# Patient Record
Sex: Female | Born: 1985 | Race: Black or African American | Hispanic: No | Marital: Single | Smoking: Never smoker
Health system: Southern US, Community
[De-identification: ages and names within clinical notes are randomized; demographics above are authoritative.]

---
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
## 2006-01-24 ENCOUNTER — Ambulatory Visit (HOSPITAL_COMMUNITY): Admission: RE | Admit: 2006-01-24 | Discharge: 2006-01-24 | Payer: Self-pay | Admitting: Obstetrics & Gynecology

## 2006-02-05 ENCOUNTER — Ambulatory Visit (HOSPITAL_COMMUNITY): Admission: RE | Admit: 2006-02-05 | Discharge: 2006-02-05 | Payer: Self-pay | Admitting: *Deleted

## 2006-02-13 ENCOUNTER — Ambulatory Visit: Payer: Self-pay | Admitting: Gynecology

## 2006-03-06 ENCOUNTER — Ambulatory Visit: Payer: Self-pay | Admitting: Gynecology

## 2006-03-27 ENCOUNTER — Ambulatory Visit: Payer: Self-pay | Admitting: Gynecology

## 2006-03-27 ENCOUNTER — Ambulatory Visit (HOSPITAL_COMMUNITY): Admission: RE | Admit: 2006-03-27 | Discharge: 2006-03-27 | Payer: Self-pay | Admitting: Obstetrics & Gynecology

## 2006-04-10 ENCOUNTER — Ambulatory Visit: Payer: Self-pay | Admitting: Family Medicine

## 2006-04-24 ENCOUNTER — Ambulatory Visit: Payer: Self-pay | Admitting: Family Medicine

## 2006-05-08 ENCOUNTER — Ambulatory Visit: Payer: Self-pay | Admitting: Gynecology

## 2006-05-15 ENCOUNTER — Ambulatory Visit (HOSPITAL_COMMUNITY): Admission: RE | Admit: 2006-05-15 | Discharge: 2006-05-15 | Payer: Self-pay | Admitting: Obstetrics & Gynecology

## 2006-05-15 ENCOUNTER — Ambulatory Visit: Payer: Self-pay | Admitting: Family Medicine

## 2006-05-29 ENCOUNTER — Ambulatory Visit: Payer: Self-pay | Admitting: Gynecology

## 2006-06-05 ENCOUNTER — Ambulatory Visit: Payer: Self-pay | Admitting: Gynecology

## 2006-06-12 ENCOUNTER — Ambulatory Visit: Payer: Self-pay | Admitting: Gynecology

## 2006-06-19 ENCOUNTER — Inpatient Hospital Stay (HOSPITAL_COMMUNITY): Admission: AD | Admit: 2006-06-19 | Discharge: 2006-06-19 | Payer: Self-pay | Admitting: *Deleted

## 2006-06-19 ENCOUNTER — Ambulatory Visit: Payer: Self-pay | Admitting: Family Medicine

## 2006-06-19 ENCOUNTER — Ambulatory Visit: Payer: Self-pay | Admitting: Obstetrics and Gynecology

## 2006-06-26 ENCOUNTER — Ambulatory Visit: Payer: Self-pay | Admitting: Gynecology

## 2006-06-27 ENCOUNTER — Inpatient Hospital Stay (HOSPITAL_COMMUNITY): Admission: AD | Admit: 2006-06-27 | Discharge: 2006-06-28 | Payer: Self-pay | Admitting: Gynecology

## 2006-06-27 ENCOUNTER — Ambulatory Visit: Payer: Self-pay | Admitting: Gynecology

## 2006-07-03 ENCOUNTER — Ambulatory Visit: Payer: Self-pay | Admitting: Obstetrics and Gynecology

## 2006-07-03 ENCOUNTER — Inpatient Hospital Stay (HOSPITAL_COMMUNITY): Admission: RE | Admit: 2006-07-03 | Discharge: 2006-07-07 | Payer: Self-pay | Admitting: Family Medicine

## 2006-07-03 ENCOUNTER — Ambulatory Visit: Payer: Self-pay | Admitting: Family Medicine

## 2007-03-04 ENCOUNTER — Ambulatory Visit: Payer: Self-pay | Admitting: Family Medicine

## 2007-03-11 ENCOUNTER — Encounter (INDEPENDENT_AMBULATORY_CARE_PROVIDER_SITE_OTHER): Payer: Self-pay | Admitting: Family Medicine

## 2007-06-23 IMAGING — US US OB COMP +14 WK
2 series · 13 of 28 positions shown · non-contrast
Comparison: none

CLINICAL DATA: Anatomy.

[Series 1: us ob comp +14 wk · 0.35mm/px · 8 of 55 slices shown (1 of 2)]
[im 4/55]
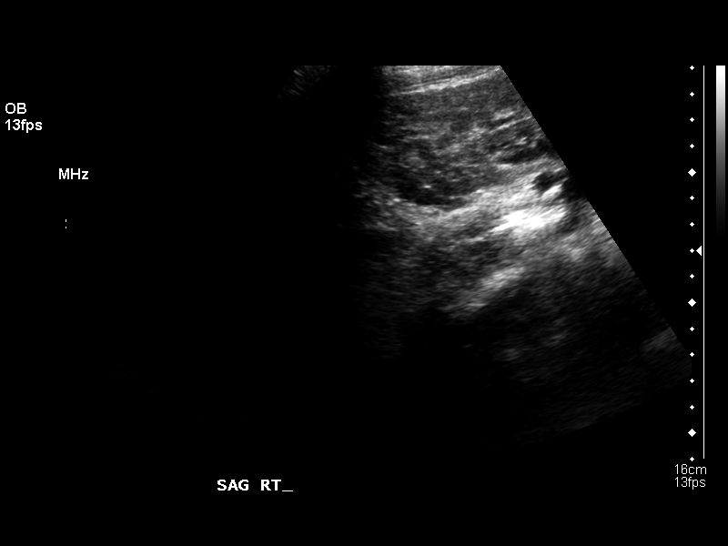
[im 11/55]
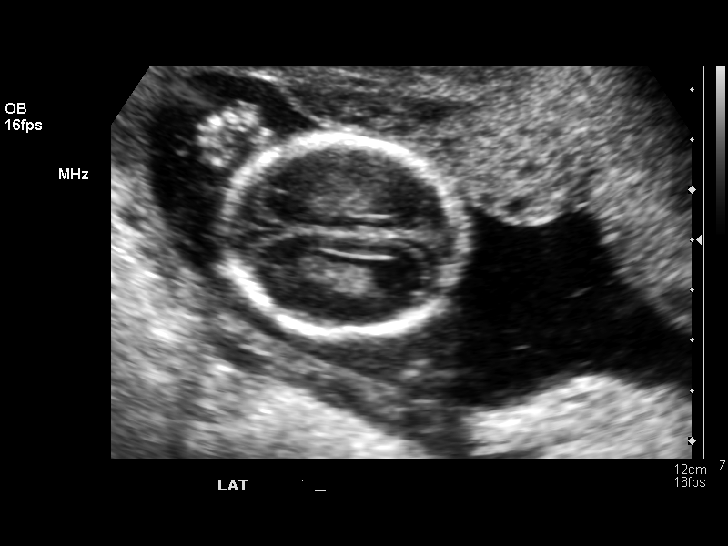
[im 17/55]
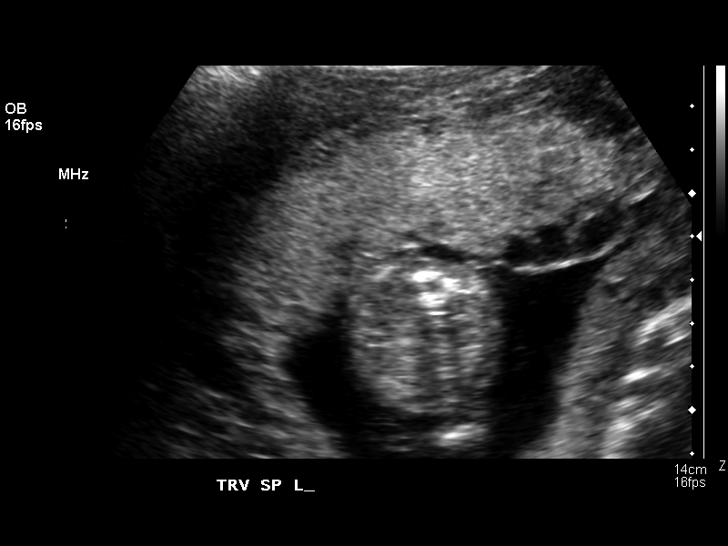
[im 24/55]
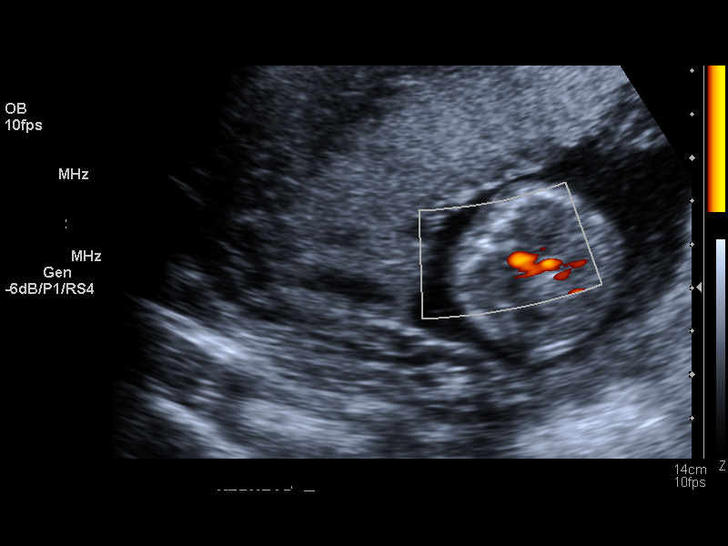
[im 31/55]
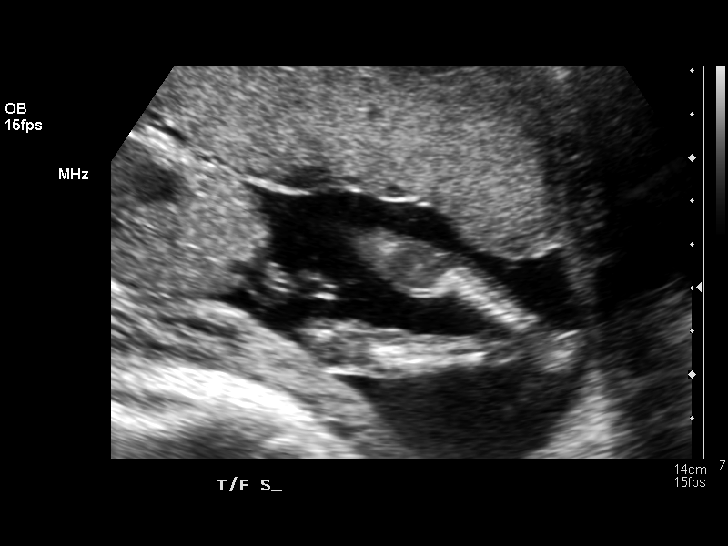
[im 38/55]
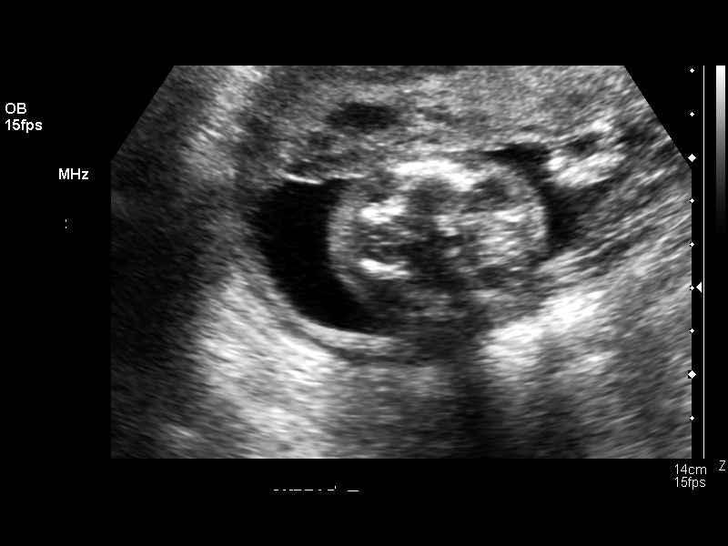
[im 48/55]
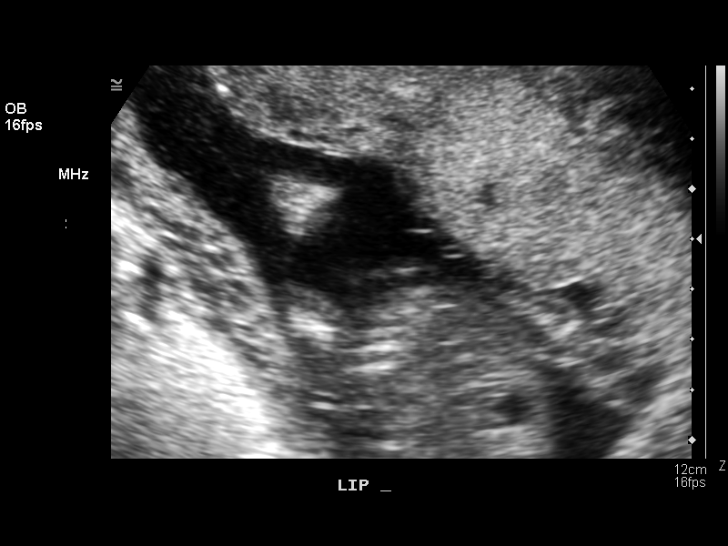
[im 55/55]
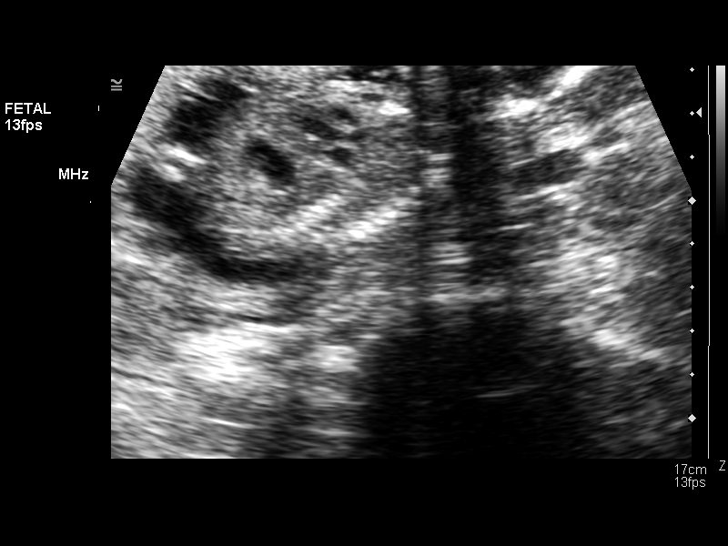

[Series 1: us ob comp +14 wk · 0.35mm/px · 5 of 36 slices shown (2 of 2)]
[im 4/36]
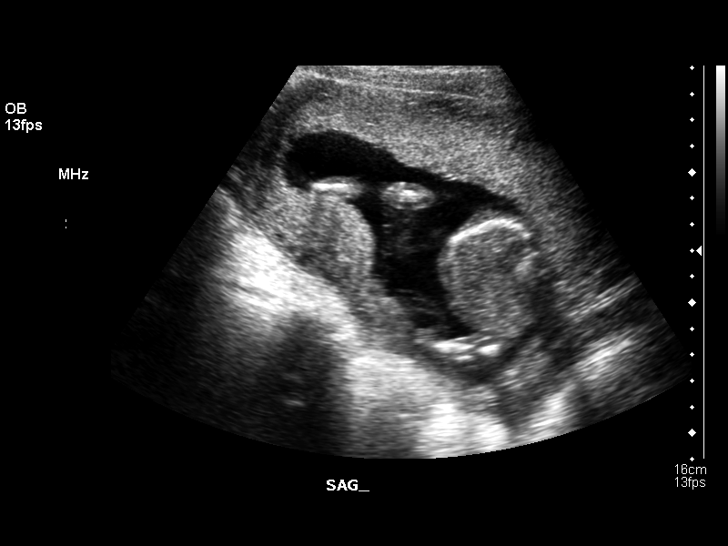
[im 11/36]
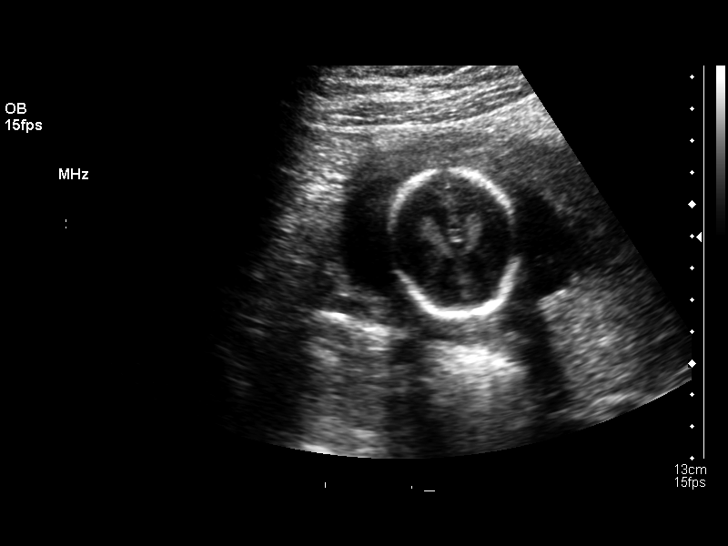
[im 18/36]
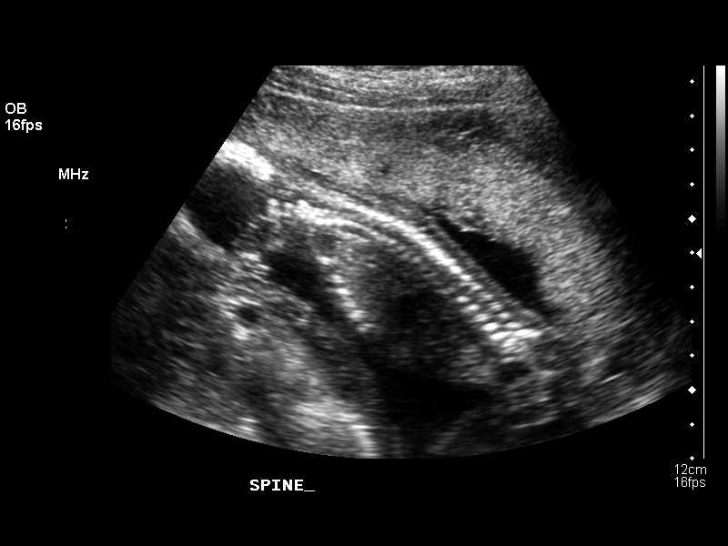
[im 25/36]
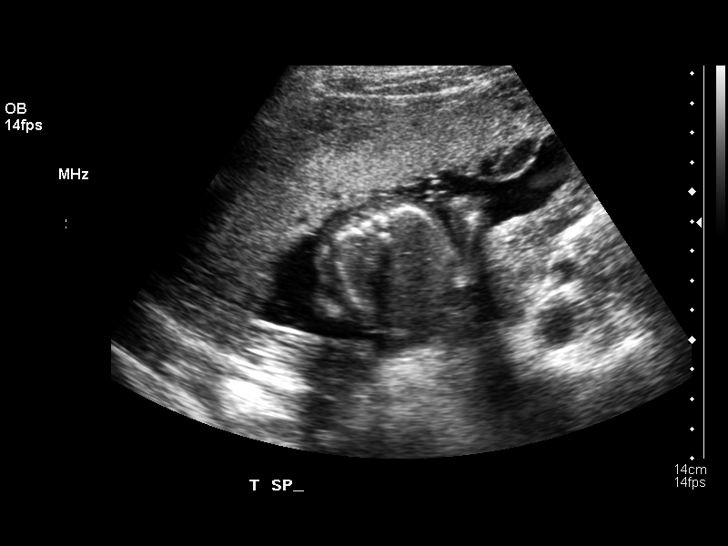
[im 32/36]
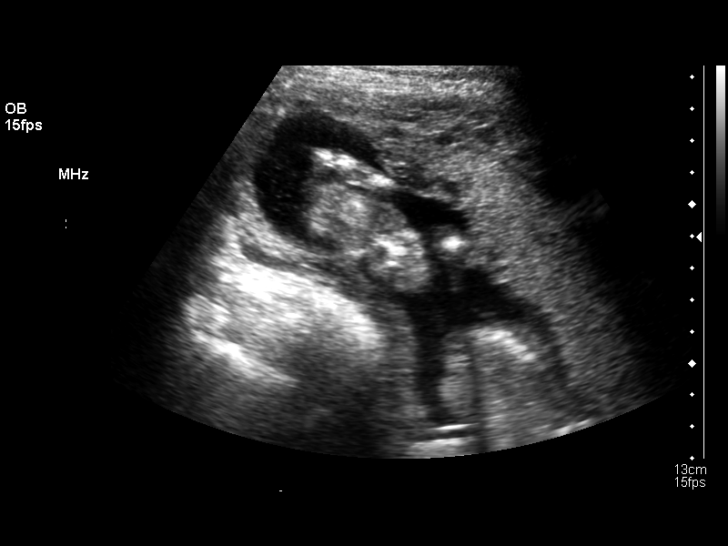

[13 of 28 positions shown; findings below may reference images not displayed]

OBSTETRICAL ULTRASOUND:
 Number of Fetuses:  1
 Heart Rate:  150
 Movement:  Yes
 Breathing:    No  
 Presentation:  Breech
 Placental Location:  Anterior
 Grade:  I
 Previa:  No
 Amniotic Fluid (Subjective):  Normal
 Amniotic Fluid (Objective):   3.7 cm Vertical pocket 

 FETAL BIOMETRY
 BPD:   4.1 cm   18 w 4 d
 HC:   15.1 cm  18 w 1 d
 AC:   12.4 cm  18 w 3 d
 FL:    2.8 cm   18 w 5 d

 MEAN GA:  18 w 3 d  US EDC:  06/24/06

 FETAL ANATOMY
 Lateral Ventricles:    Visualized 
 Thalami/CSP:      Visualized 
 Posterior Fossa:  Visualized 
 Nuchal Region:    Visualized 
 Spine:      Visualized 
 4 Chamber Heart on Left:      Not visualized 
 Stomach on Left:      Visualized 
 3 Vessel Cord:    Visualized 
 Cord Insertion site:    Visualized 
 Kidneys:  Visualized 
 Bladder:  Visualized 
 Extremities:      Visualized 

 ADDITIONAL ANATOMY VISUALIZED:  RVOT, upper lip, orbits, profile, diaphragm, heel, 5th digit, aortic arch, and male genitalia.

 MATERNAL UTERINE AND ADNEXAL FINDINGS
 Cervix:   3.4 cm Transabdominally
IMPRESSION: 1.  Single living intrauterine fetus in breech presentation with subjectively normal amniotic fluid volume.  The estimated mean gestational age by ultrasound today is 18 weeks 3 days with good concordance of the fetal biometric parameters.
 2.  Visualized fetal anatomy is unremarkable although complete imaging of the heart could not be obtained secondary to fetal position.  The inferior margin of the anterior placenta is 2 cm from the internal os.  Follow-up imaging may be indicated to more definitely characterize fetal heart and the placenta could be reassessed at the time as well.

## 2007-10-09 ENCOUNTER — Telehealth (INDEPENDENT_AMBULATORY_CARE_PROVIDER_SITE_OTHER): Payer: Self-pay | Admitting: *Deleted

## 2007-10-15 ENCOUNTER — Emergency Department (HOSPITAL_COMMUNITY): Admission: EM | Admit: 2007-10-15 | Discharge: 2007-10-15 | Payer: Self-pay | Admitting: Family Medicine

## 2007-11-04 ENCOUNTER — Ambulatory Visit: Payer: Self-pay | Admitting: Nurse Practitioner

## 2007-11-04 DIAGNOSIS — E669 Obesity, unspecified: Secondary | ICD-10-CM

## 2007-11-04 LAB — CONVERTED CEMR LAB
Albumin: 4.3 g/dL (ref 3.5–5.2)
BUN: 12 mg/dL (ref 6–23)
Basophils Absolute: 0 10*3/uL (ref 0.0–0.1)
Calcium: 8.8 mg/dL (ref 8.4–10.5)
Chloride: 105 meq/L (ref 96–112)
Creatinine, Ser: 0.63 mg/dL (ref 0.40–1.20)
Eosinophils Relative: 2 % (ref 0–5)
Glucose, Bld: 98 mg/dL (ref 70–99)
Neutrophils Relative %: 61 % (ref 43–77)
Platelets: 387 10*3/uL (ref 150–400)
Potassium: 4.6 meq/L (ref 3.5–5.3)
RBC: 4.88 M/uL (ref 3.87–5.11)
Sodium: 142 meq/L (ref 135–145)
TSH: 2.626 microintl units/mL (ref 0.350–5.50)
Total Protein: 7.3 g/dL (ref 6.0–8.3)

## 2007-11-05 ENCOUNTER — Encounter (INDEPENDENT_AMBULATORY_CARE_PROVIDER_SITE_OTHER): Payer: Self-pay | Admitting: Nurse Practitioner

## 2007-11-26 ENCOUNTER — Other Ambulatory Visit: Admission: RE | Admit: 2007-11-26 | Discharge: 2007-11-26 | Payer: Self-pay | Admitting: Internal Medicine

## 2007-11-26 ENCOUNTER — Ambulatory Visit: Payer: Self-pay | Admitting: Nurse Practitioner

## 2007-11-26 DIAGNOSIS — M25549 Pain in joints of unspecified hand: Secondary | ICD-10-CM

## 2007-11-26 LAB — CONVERTED CEMR LAB
Bilirubin Urine: NEGATIVE
Blood in Urine, dipstick: NEGATIVE
Chlamydia, DNA Probe: NEGATIVE
GC Probe Amp, Genital: NEGATIVE
KOH Prep: NEGATIVE
Nitrite: POSITIVE
Rhuematoid fact SerPl-aCnc: <20 intl units/mL (ref 0–20)

## 2007-12-01 ENCOUNTER — Encounter (INDEPENDENT_AMBULATORY_CARE_PROVIDER_SITE_OTHER): Payer: Self-pay | Admitting: Nurse Practitioner

## 2007-12-09 ENCOUNTER — Ambulatory Visit: Payer: Self-pay | Admitting: Nurse Practitioner

## 2007-12-09 DIAGNOSIS — B977 Papillomavirus as the cause of diseases classified elsewhere: Secondary | ICD-10-CM | POA: Insufficient documentation

## 2007-12-09 DIAGNOSIS — R87610 Atypical squamous cells of undetermined significance on cytologic smear of cervix (ASC-US): Secondary | ICD-10-CM

## 2007-12-10 ENCOUNTER — Telehealth (INDEPENDENT_AMBULATORY_CARE_PROVIDER_SITE_OTHER): Payer: Self-pay | Admitting: *Deleted

## 2007-12-25 ENCOUNTER — Encounter: Admission: RE | Admit: 2007-12-25 | Discharge: 2008-03-24 | Payer: Self-pay | Admitting: Nurse Practitioner

## 2007-12-25 ENCOUNTER — Encounter (INDEPENDENT_AMBULATORY_CARE_PROVIDER_SITE_OTHER): Payer: Self-pay | Admitting: Nurse Practitioner

## 2008-02-11 ENCOUNTER — Encounter (INDEPENDENT_AMBULATORY_CARE_PROVIDER_SITE_OTHER): Payer: Self-pay | Admitting: Nurse Practitioner

## 2008-11-04 ENCOUNTER — Encounter (INDEPENDENT_AMBULATORY_CARE_PROVIDER_SITE_OTHER): Payer: Self-pay | Admitting: Nurse Practitioner

## 2010-04-06 ENCOUNTER — Other Ambulatory Visit: Admission: RE | Admit: 2010-04-06 | Discharge: 2010-04-06 | Payer: Self-pay | Admitting: Internal Medicine

## 2010-04-06 ENCOUNTER — Ambulatory Visit: Payer: Self-pay | Admitting: Nurse Practitioner

## 2010-04-06 DIAGNOSIS — R3129 Other microscopic hematuria: Secondary | ICD-10-CM

## 2010-04-06 LAB — CONVERTED CEMR LAB
ALT: 9 units/L (ref 0–35)
AST: 13 units/L (ref 0–37)
Basophils Relative: 0 % (ref 0–1)
Bilirubin Urine: NEGATIVE
Calcium: 9.2 mg/dL (ref 8.4–10.5)
Chlamydia, DNA Probe: NEGATIVE
Creatinine, Ser: 0.87 mg/dL (ref 0.40–1.20)
Eosinophils Absolute: 0 10*3/uL (ref 0.0–0.7)
GC Probe Amp, Genital: NEGATIVE
Glucose, Bld: 89 mg/dL (ref 70–99)
Glucose, Urine, Semiquant: NEGATIVE
HCT: 37.2 % (ref 36.0–46.0)
KOH Prep: NEGATIVE
MCHC: 33.3 g/dL (ref 30.0–36.0)
Monocytes Absolute: 0.5 10*3/uL (ref 0.1–1.0)
Monocytes Relative: 8 % (ref 3–12)
Neutrophils Relative %: 65 % (ref 43–77)
Nitrite: NEGATIVE
Platelets: 347 10*3/uL (ref 150–400)
Potassium: 3.9 meq/L (ref 3.5–5.3)
Protein, U semiquant: 30
RDW: 15.5 % (ref 11.5–15.5)
Sodium: 141 meq/L (ref 135–145)
Specific Gravity, Urine: >=1.03
Urobilinogen, UA: 1
pH: 5.5

## 2010-04-07 ENCOUNTER — Encounter (INDEPENDENT_AMBULATORY_CARE_PROVIDER_SITE_OTHER): Payer: Self-pay | Admitting: Nurse Practitioner

## 2010-04-09 ENCOUNTER — Encounter (INDEPENDENT_AMBULATORY_CARE_PROVIDER_SITE_OTHER): Payer: Self-pay | Admitting: Nurse Practitioner

## 2010-04-13 LAB — CONVERTED CEMR LAB: Pap Smear: NEGATIVE

## 2010-07-06 ENCOUNTER — Ambulatory Visit: Payer: Self-pay | Admitting: Internal Medicine

## 2010-07-06 DIAGNOSIS — N949 Unspecified condition associated with female genital organs and menstrual cycle: Secondary | ICD-10-CM

## 2010-07-06 LAB — CONVERTED CEMR LAB: Beta hcg, urine, semiquantitative: POSITIVE

## 2010-10-09 ENCOUNTER — Ambulatory Visit (HOSPITAL_COMMUNITY)
Admission: RE | Admit: 2010-10-09 | Discharge: 2010-10-09 | Payer: Self-pay | Source: Home / Self Care | Attending: Obstetrics and Gynecology | Admitting: Obstetrics and Gynecology

## 2010-11-18 ENCOUNTER — Encounter: Payer: Self-pay | Admitting: *Deleted

## 2010-11-27 NOTE — Progress Notes (Signed)
Summary: Office Visit//DEPRESSION SCREENING  Office Visit//DEPRESSION SCREENING   Imported By: Arta Bruce 05/08/2010 12:55:42  _____________________________________________________________________  External Attachment:    Type:   Image     Comment:   External Document

## 2010-11-27 NOTE — Letter (Signed)
Summary: *HSN Results Follow up  [facility]  [address] [place], [place] [address]   Phone: [phone]  Fax: [phone]      04/09/2010   Selinda Flavin [address] [place], [place]  [address]   Dear  Ms. [facility] Paulo,                            ____S.Drinkard,FNP   ____D. Gore,FNP       ____B. McPherson,MD   ____V. Rankins,MD    ____E. Mulberry,MD    _X___N. Daphine Deutscher, FNP  ____D. Reche Dixon, MD    ____K. Philipp Deputy, MD    ____Other     This letter is to inform you that your recent test(s):  ___X____Pap Smear    ___X____Lab Test     _______X-ray    ____X___ is within acceptable limits  _______ requires a medication change  _______ requires a follow-up lab visit  _______ requires a follow-up visit with your provider   Comments: Labs done during recent office visit normal.  Pap Smear results ________________________________.       _________________________________________________________ If you have any questions, please contact our office [phone].                    Sincerely,    Lehman Prom FNP [facility]

## 2010-11-27 NOTE — Assessment & Plan Note (Signed)
Summary: TO SEE IF SHE'S PREGNANT//   Vital Signs:  Patient profile:   25 year old female Menstrual status:  regular Weight:      265.4 pounds Temp:     98.1 degrees F oral Pulse rate:   96 / minute Pulse rhythm:   regular Resp:     20 per minute BP sitting:   130 / 72  (left arm) Cuff size:   large  Vitals Entered By: Levon Hedger (July 06, 2010 4:38 PM) CC: has not had cycle for this month and wants to be tested for pregnancy Is Patient Diabetic? No Pain Assessment Patient in pain? no       Does patient need assistance? Functional Status Self care Ambulation Normal   CC:  has not had cycle for this month and wants to be tested for pregnancy.  History of Present Illness: Pt. missed her period on the 2nd of Sept--no other symptoms.  Currently taking Clindamycin and Hydrocodone for having wisdom teeth removed a couple days ago.  Not taking prenatal vitamins.  Allergies (verified): No Known Drug Allergies  Physical Exam  General:  NAD   Impression & Recommendations:  Problem # 1:  DELAYED MENSES (ICD-626.8) PNV and number for OB clinic given Encouraged a healthy diet. Orders: Urine Pregnancy Test  ([address])  Complete Medication List: 1)  Prenatal Vitamins 0.8 Mg Tabs (Prenatal multivit-min-fe-fa) .Marland Kitchen.. 1 tab by mouth daily Prescriptions: PRENATAL VITAMINS 0.8 MG TABS (PRENATAL MULTIVIT-MIN-FE-FA) 1 tab by mouth daily  #30 x 11   Entered and Authorized by:   Julieanne Manson MD   Signed by:   Julieanne Manson MD on 07/06/2010   Method used:   Electronically to        [facility] [address] [phone]* (retail)       [address]       [place], [place]  [address]       Ph: [phone] or [phone]       Fax: [phone]   RxID:   [id number]   Laboratory Results   Urine Tests  Date/Time Received: July 06, 2010 4:43 PM     Urine HCG: positive

## 2010-11-27 NOTE — Assessment & Plan Note (Signed)
Summary: Complete Physical Exam   Vital Signs:  Patient profile:   25 year old female Menstrual status:  regular LMP:     03/28/2010 Height:      68.25 inches Weight:      270.2 pounds BMI:     40.93 BSA:     2.33 Temp:     98.2 degrees F oral Pulse rate:   97 / minute Pulse rhythm:   regular Resp:     20 per minute BP sitting:   117 / 80  (left arm) Cuff size:   large  Vitals Entered By: Levon Hedger (April 06, 2010 3:48 PM)  Nutrition Counseling: Patient's BMI is greater than 25 and therefore counseled on weight management options. Is Patient Diabetic? No Pain Assessment Patient in pain? no       Does patient need assistance? Functional Status Self care Ambulation Normal LMP (date): 03/28/2010 LMP - Character: medium     Menstrual Status regular Enter LMP: 03/28/2010 Last PAP Result ASCUS   History of Present Illness: Pt into the office for a complete physical exam  PAP - done last here in 2009 all previousl normal PAP  77 year old son Implanon removed in 05/2009 after the 3 year course.   Condom use at this time  Mammogram - no history of breast exams no family history of breast exams no breast exams at home  Optho - pt is supposed to wear glasses but she has not been wearing them  Dental - no recent dental exam  tdap - last done in 2009    Habits & Providers  Alcohol-Tobacco-Diet     Alcohol drinks/day: 0     Tobacco Status: never  Exercise-Depression-Behavior     Does Patient Exercise: yes     Type of exercise: sit ups,  bike     Have you felt down or hopeless? no     Have you felt little pleasure in things? no     Depression Counseling: not indicated; screening negative for depression     Drug Use: no     Seat Belt Use: 100     Sun Exposure: occasionally  Comments: PHQ-9 = 1  Medications Prior to Update: 1)  None  Allergies (verified): No Known Drug Allergies  Review of Systems General:  Denies fever. Eyes:  Denies  discharge. ENT:  Denies earache. CV:  Denies chest pain or discomfort. Resp:  Denies chest discomfort. GI:  Denies abdominal pain, nausea, and vomiting. GU:  Denies discharge. MS:  Denies joint pain. Derm:  Denies rash. Neuro:  Denies headaches. Psych:  Denies depression.  Physical Exam  General:  alert.   Head:  normocephalic.   Eyes:  pupils round.   Ears:  bil TM with bony landmarks present Nose:  no nasal discharge.   Mouth:  pharynx pink and moist.   Neck:  supple.   Chest Wall:  no mass.   Breasts:  pendulous no masses and no abnormal thickening.   Lungs:  normal breath sounds.   Heart:  normal rate and regular rhythm.   Abdomen:  soft and non-tender.   Rectal:  external hemorrhoid(s).   Msk:  up to the exam table Pulses:  R radial normal and L radial normal.   Extremities:  no edema Neurologic:  alert & oriented X3 and gait normal.   Skin:  color normal.   Psych:  Oriented X3.    Pelvic Exam  Vulva:      normal appearance.  Urethra and Bladder:      Urethra--normal.   Vagina:      physiologic discharge.   Cervix:      midposition.   Uterus:      smooth.   Adnexa:      nontender bilaterally.      Impression & Recommendations:  Problem # 1:  ROUTINE GYNECOLOGICAL EXAMINATION (ICD-V72.31) labs done  PAP done rec optho and dental exam spoke with pt about condom use and STD prevention  Orders: KOH/ WET Mount [phone]) Pap Smear, Thin Prep ( Collection of) [phone]) UA Dipstick w/o Micro (manual) (84166) T-Comprehensive Metabolic Panel (06301-60109) T-CBC w/Diff (32355-73220) Rapid HIV  (92370) T- GC Chlamydia ([address])  Problem # 2:  MICROSCOPIC HEMATURIA (ICD-599.72) will send urine for culture Orders: T-Culture, Urine ([id number])  Problem # 3:  OBESITY (ICD-278.00) spoke with pt about the needs to exercise and lose weight Orders: T-TSH (51761-60737)  Patient Instructions: 1)  You will be informed of any abnormal lab results. 2)  Follow  up as needed   Laboratory Results   Urine Tests  Date/Time Received: April 06, 2010 4:06 PM   Routine Urinalysis   Color: dk yellow Glucose: negative   (Normal Range: Negative) Bilirubin: negative   (Normal Range: Negative) Ketone: trace (5)   (Normal Range: Negative) Spec. Gravity: >=1.030   (Normal Range: 1.003-1.035) Blood: small   (Normal Range: Negative) pH: 5.5   (Normal Range: 5.0-8.0) Protein: 30   (Normal Range: Negative) Urobilinogen: 1.0   (Normal Range: 0-1) Nitrite: negative   (Normal Range: Negative) Leukocyte Esterace: small   (Normal Range: Negative)    Date/Time Received: April 06, 2010 4:59 PM   Wet Mount/KOH Source: vaginal WBC/hpf: 1-5 Bacteria/hpf: rare Clue cells/hpf: none Yeast/hpf: none Trichomonas/hpf: none  Other Tests  Rapid HIV: negative    Laboratory Results   Urine Tests    Routine Urinalysis   Color: dk yellow Glucose: negative   (Normal Range: Negative) Bilirubin: negative   (Normal Range: Negative) Ketone: trace (5)   (Normal Range: Negative) Spec. Gravity: >=1.030   (Normal Range: 1.003-1.035) Blood: small   (Normal Range: Negative) pH: 5.5   (Normal Range: 5.0-8.0) Protein: 30   (Normal Range: Negative) Urobilinogen: 1.0   (Normal Range: 0-1) Nitrite: negative   (Normal Range: Negative) Leukocyte Esterace: small   (Normal Range: Negative)      Wet Mount Wet Mount KOH: Negative  Other Tests  Rapid HIV: negative

## 2011-01-30 ENCOUNTER — Other Ambulatory Visit: Payer: Self-pay | Admitting: Obstetrics and Gynecology

## 2011-03-01 ENCOUNTER — Inpatient Hospital Stay (HOSPITAL_COMMUNITY)
Admission: AD | Admit: 2011-03-01 | Discharge: 2011-03-01 | Disposition: A | Discharge status: Home / Self Care | Payer: Medicaid Other | Source: Ambulatory Visit | Attending: Obstetrics and Gynecology | Admitting: Obstetrics and Gynecology

## 2011-03-01 DIAGNOSIS — O479 False labor, unspecified: Secondary | ICD-10-CM | POA: Insufficient documentation

## 2011-03-12 ENCOUNTER — Inpatient Hospital Stay (HOSPITAL_COMMUNITY)
Admission: RE | Admit: 2011-03-12 | Discharge: 2011-03-14 | DRG: 775 | Disposition: A | Discharge status: Home / Self Care | Payer: Medicaid Other | Source: Ambulatory Visit | Attending: Obstetrics & Gynecology | Admitting: Obstetrics & Gynecology

## 2011-03-12 DIAGNOSIS — Z22330 Carrier of Group B streptococcus: Secondary | ICD-10-CM

## 2011-03-12 DIAGNOSIS — O99892 Other specified diseases and conditions complicating childbirth: Principal | ICD-10-CM | POA: Diagnosis present

## 2011-03-12 LAB — CBC
Hemoglobin: 11.5 g/dL — ABNORMAL LOW (ref 12.0–15.0)
MCH: 27.5 pg (ref 26.0–34.0)
MCHC: 33 g/dL (ref 30.0–36.0)
MCV: 83.3 fL (ref 78.0–100.0)
RDW: 15.4 % (ref 11.5–15.5)

## 2011-03-13 LAB — CBC
Hemoglobin: 11.1 g/dL — ABNORMAL LOW (ref 12.0–15.0)
MCH: 27.3 pg (ref 26.0–34.0)
MCV: 83.8 fL (ref 78.0–100.0)
RBC: 4.07 MIL/uL (ref 3.87–5.11)
RDW: 15.6 % — ABNORMAL HIGH (ref 11.5–15.5)
WBC: 11.3 10*3/uL — ABNORMAL HIGH (ref 4.0–10.5)

## 2011-03-13 LAB — RPR: RPR Ser Ql: REACTIVE — AB

## 2011-03-15 ENCOUNTER — Inpatient Hospital Stay (HOSPITAL_COMMUNITY): Admission: AD | Admit: 2011-03-15 | Payer: Self-pay | Admitting: Obstetrics and Gynecology

## 2011-03-15 NOTE — Op Note (Signed)
NAMEMarland Kitchen  Rebecca Savage, Rebecca Savage            ACCOUNT NO.:  [id number]   MEDICAL RECORD NO.:  [id number]           PATIENT TYPE:   LOCATION:                                FACILITY:  [facility]   PHYSICIAN:  Phil D. Okey Dupre, M.D.     DATE OF BIRTH:  1985/12/01   DATE OF PROCEDURE:  07/05/2006  DATE OF DISCHARGE:                                 OPERATIVE REPORT   OPERATIVE DELIVERY NOTE   PREOPERATIVE DIAGNOSIS:  Fetal bradycardia   POSTOPERATIVE DIAGNOSIS:  Fetal bradycardia.   PROCEDURE:  Vacuum-assisted vaginal delivery and right mediolateral  episiotomy, repair.   OBSTETRICIAN:  Phil D. Okey Dupre, M.D.   ASSISTANT:  Paticia Stack, MD   ANESTHESIA:  Epidural.  Infant was a female with an Apgar of 9 and 9.  Weight  and cord pH not available as yet.  The reason for operative delivery is a  patient at term who was induced over a period of 1-1/2 days, finally,  brought the baby down to a +4, vertex in a LOT presentation.  The baby  suddenly became bradycardic into the 60s, not related to contractions.  It  was decided to do a vacuum-assist delivery.   The patient was placed in the dorsal lithotomy position.  The procedure went  as follows:  The patient under satisfactory epidural anesthesia, placed in  the dorsal lithotomy position.  The perineum was prepped and draped in the  usual sterile manner.  A mushroom vacuum was applied and 4 pulls were needed  over a right mediolateral episiotomy to deliver the baby quite easily.  Cord  was doubly clamped, divided, and the baby handed to the pediatrician.  Samples of blood were taken from the cord for analysis.  The placenta was  spontaneously removed after the episiotomy had been repaired with a 2-0  chromic catgut running suture; except on the perineum, where interrupted  muscular sutures were placed to build up the perineum.  Estimated blood loss  during the procedure was 400 mL.  The patient tolerated the procedure well  and had a normal postpartum  course.           ______________________________  Javier Glazier. Okey Dupre, M.D.     PDR/MEDQ  D:  07/05/2006  T:  07/05/2006  Job:  [id number]

## 2011-04-29 ENCOUNTER — Other Ambulatory Visit: Payer: Self-pay | Admitting: Obstetrics & Gynecology

## 2011-08-02 LAB — POCT URINALYSIS DIP (DEVICE)
Glucose, UA: NEGATIVE
Ketones, ur: NEGATIVE
Nitrite: NEGATIVE
Urobilinogen, UA: 1
pH: 8.5 — ABNORMAL HIGH

## 2011-08-02 LAB — I-STAT 8, (EC8 V) (CONVERTED LAB)
BUN: 11
Chloride: 103
HCT: 45
Hemoglobin: 15.3 — ABNORMAL HIGH
Sodium: 137

## 2011-08-02 LAB — POCT I-STAT CREATININE
Creatinine, Ser: 0.9
Operator id: 247071

## 2012-09-27 ENCOUNTER — Emergency Department (HOSPITAL_COMMUNITY)
Admission: EM | Admit: 2012-09-27 | Discharge: 2012-09-27 | Disposition: A | Discharge status: Home / Self Care | Payer: Medicaid Other | Source: Home / Self Care

## 2012-09-27 ENCOUNTER — Encounter (HOSPITAL_COMMUNITY): Payer: Self-pay | Admitting: Emergency Medicine

## 2012-09-27 DIAGNOSIS — H109 Unspecified conjunctivitis: Secondary | ICD-10-CM

## 2012-09-27 MED ORDER — AZITHROMYCIN 1 % OP SOLN: 2.0000 [drp] | Freq: Two times a day (BID) | OPHTHALMIC | Status: DC

## 2012-09-27 MED ORDER — POLYMYXIN B-TRIMETHOPRIM 10000-0.1 UNIT/ML-% OP SOLN: 1.0000 [drp] | Freq: Four times a day (QID) | OPHTHALMIC | Status: DC

## 2012-09-27 MED ORDER — AZITHROMYCIN 1 % OP SOLN: 1.0000 [drp] | Freq: Every day | OPHTHALMIC | Status: DC

## 2012-09-27 NOTE — ED Provider Notes (Signed)
History     CSN: [id number]  Arrival date & time 09/27/12  1216   None     Chief Complaint  Patient presents with  . Conjunctivitis    bilateral eye redness, irritation, and drainage    (Consider location/radiation/quality/duration/timing/severity/associated sxs/prior treatment) HPI Comments: Pt with congestion recently. 4 days ago developed redness and drainage in L eye that then spread to R eye. Feels L eye is slowly improving, R eye is not getting better. Eyes initially hurt a little, no pain now.   Patient is a 26 y.o. female presenting with conjunctivitis. The history is provided by the patient.  Conjunctivitis  Episode onset: 4 days ago. The problem occurs continuously. The problem has been unchanged. The problem is moderate. Nothing relieves the symptoms. Nothing aggravates the symptoms. Associated symptoms include eye itching, congestion, rhinorrhea, eye discharge, eye pain and eye redness. Pertinent negatives include no fever, no decreased vision and no photophobia. The eye pain is mild. Both eyes are affected.The eye pain is not associated with movement. The eyelid exhibits no abnormality.    History reviewed. No pertinent past medical history.  History reviewed. No pertinent past surgical history.  History reviewed. No pertinent family history.  History  Substance Use Topics  . Smoking status: Never Smoker   . Smokeless tobacco: Not on file  . Alcohol Use: No    OB History    Grav Para Term Preterm Abortions TAB SAB Ect Mult Living                  Review of Systems  Constitutional: Negative for fever and chills.  HENT: Positive for congestion and rhinorrhea.   Eyes: Positive for pain, discharge, redness and itching. Negative for photophobia and visual disturbance.    Allergies  Review of patient's allergies indicates no known allergies.  Home Medications   Current Outpatient Rx  Name  Route  Sig  Dispense  Refill  . AZITHROMYCIN 1 % OP SOLN   Both  Eyes   Place 1 drop into both eyes daily. 2 drops each eye for first day only, then 1 drop each eye for 4 days.   2.5 mL   0     BP 126/75  Pulse 61  Temp 98.1 F (36.7 C) (Oral)  Resp 17  SpO2 99%  LMP 09/10/2012  Physical Exam  Constitutional: She appears well-developed and well-nourished.  Eyes: EOM are normal. Pupils are equal, round, and reactive to light. Right eye exhibits exudate. Left eye exhibits exudate. Right conjunctiva is injected. Left conjunctiva is injected.       Bilateral vision 20/50, R and L vision 20/100    ED Course  Procedures (including critical care time)  Labs Reviewed - No data to display No results found.   1. Conjunctivitis       MDM  Pharmacy does not carry original med prescribed- polymyxin/trimethoprim substituted.         Cathlyn Parsons, NP 09/27/12 1435  Cathlyn Parsons, NP 09/27/12 [phone]

## 2012-09-27 NOTE — ED Notes (Signed)
Pt c/o bilateral eye redness and irritation with drainage since 11/28. Pt has used visine with mild relief of symptoms. Pt denies any other symptoms.

## 2012-09-29 NOTE — ED Provider Notes (Signed)
Medical screening examination/treatment/procedure(s) were performed by non-physician practitioner and as supervising physician I was immediately available for consultation/collaboration.   MORENO-COLL,Traeh Milroy; MD   Mikiala Fugett Moreno-Coll, MD 09/29/12 0843 

## 2013-08-17 ENCOUNTER — Other Ambulatory Visit: Payer: Self-pay | Admitting: Obstetrics & Gynecology

## 2013-10-21 ENCOUNTER — Emergency Department (HOSPITAL_COMMUNITY)
Admission: EM | Admit: 2013-10-21 | Discharge: 2013-10-21 | Disposition: A | Discharge status: Home / Self Care | Payer: Self-pay | Attending: Emergency Medicine | Admitting: Emergency Medicine

## 2013-10-21 ENCOUNTER — Encounter (HOSPITAL_COMMUNITY): Payer: Self-pay | Admitting: Emergency Medicine

## 2013-10-21 ENCOUNTER — Emergency Department (HOSPITAL_COMMUNITY): Payer: Self-pay

## 2013-10-21 DIAGNOSIS — Z3202 Encounter for pregnancy test, result negative: Secondary | ICD-10-CM | POA: Insufficient documentation

## 2013-10-21 DIAGNOSIS — N390 Urinary tract infection, site not specified: Secondary | ICD-10-CM

## 2013-10-21 DIAGNOSIS — R112 Nausea with vomiting, unspecified: Secondary | ICD-10-CM | POA: Insufficient documentation

## 2013-10-21 DIAGNOSIS — IMO0001 Reserved for inherently not codable concepts without codable children: Secondary | ICD-10-CM | POA: Insufficient documentation

## 2013-10-21 DIAGNOSIS — J111 Influenza due to unidentified influenza virus with other respiratory manifestations: Secondary | ICD-10-CM

## 2013-10-21 LAB — CBC WITH DIFFERENTIAL/PLATELET
Basophils Absolute: 0 10*3/uL (ref 0.0–0.1)
Basophils Relative: 0 % (ref 0–1)
Eosinophils Relative: 1 % (ref 0–5)
HCT: 34 % — ABNORMAL LOW (ref 36.0–46.0)
Hemoglobin: 11.5 g/dL — ABNORMAL LOW (ref 12.0–15.0)
Lymphocytes Relative: 7 % — ABNORMAL LOW (ref 12–46)
MCHC: 33.8 g/dL (ref 30.0–36.0)
MCV: 85.2 fL (ref 78.0–100.0)
Monocytes Absolute: 0.5 10*3/uL (ref 0.1–1.0)
Monocytes Relative: 8 % (ref 3–12)
Neutro Abs: 5 10*3/uL (ref 1.7–7.7)
RDW: 14.7 % (ref 11.5–15.5)

## 2013-10-21 LAB — URINALYSIS, ROUTINE W REFLEX MICROSCOPIC
Bilirubin Urine: NEGATIVE
Glucose, UA: NEGATIVE mg/dL
Ketones, ur: 15 mg/dL — AB
Nitrite: POSITIVE — AB
Protein, ur: 30 mg/dL — AB
Urobilinogen, UA: 1 mg/dL (ref 0.0–1.0)

## 2013-10-21 LAB — COMPREHENSIVE METABOLIC PANEL
ALT: 8 U/L (ref 0–35)
AST: 14 U/L (ref 0–37)
BUN: 8 mg/dL (ref 6–23)
CO2: 27 mEq/L (ref 19–32)
Calcium: 8.7 mg/dL (ref 8.4–10.5)
Chloride: 99 mEq/L (ref 96–112)
Creatinine, Ser: 0.74 mg/dL (ref 0.50–1.10)
GFR calc non Af Amer: >90 mL/min (ref >90)
Sodium: 137 mEq/L (ref 135–145)
Total Bilirubin: 0.4 mg/dL (ref 0.3–1.2)

## 2013-10-21 LAB — LIPASE, BLOOD: Lipase: 16 U/L (ref 11–59)

## 2013-10-21 LAB — URINE MICROSCOPIC-ADD ON

## 2013-10-21 LAB — POCT PREGNANCY, URINE: Preg Test, Ur: NEGATIVE

## 2013-10-21 MED ORDER — CEPHALEXIN 500 MG PO CAPS
500.0000 mg | ORAL_CAPSULE | Freq: Four times a day (QID) | ORAL | Status: AC
Start: 2013-10-21 — End: ?

## 2013-10-21 MED ORDER — ONDANSETRON HCL 4 MG/2ML IJ SOLN
4.0000 mg | Freq: Once | INTRAMUSCULAR | Status: DC
  Filled 2013-10-21: qty 2

## 2013-10-21 MED ORDER — ACETAMINOPHEN 500 MG PO TABS
1000.0000 mg | ORAL_TABLET | Freq: Once | ORAL | Status: AC
  Administered 2013-10-21: 1000 mg via ORAL
  Filled 2013-10-21: qty 2

## 2013-10-21 MED ORDER — SODIUM CHLORIDE 0.9 % IV BOLUS (SEPSIS)
1000.0000 mL | Freq: Once | INTRAVENOUS | Status: AC
  Administered 2013-10-21: 1000 mL via INTRAVENOUS

## 2013-10-21 MED ORDER — ALBUTEROL SULFATE (5 MG/ML) 0.5% IN NEBU
2.5000 mg | INHALATION_SOLUTION | Freq: Once | RESPIRATORY_TRACT | Status: AC
  Administered 2013-10-21: 2.5 mg via RESPIRATORY_TRACT
  Filled 2013-10-21: qty 0.5

## 2013-10-21 MED ORDER — DEXTROSE 5 % IV SOLN
1.0000 g | Freq: Once | INTRAVENOUS | Status: AC
  Administered 2013-10-21: 1 g via INTRAVENOUS
  Filled 2013-10-21: qty 10

## 2013-10-21 MED ORDER — CEPHALEXIN 500 MG PO CAPS: 500.0000 mg | ORAL_CAPSULE | Freq: Four times a day (QID) | ORAL | Status: DC

## 2013-10-21 MED ORDER — CEPHALEXIN 500 MG PO CAPS: 500.0000 mg | ORAL_CAPSULE | Freq: Four times a day (QID) | ORAL | Status: AC

## 2013-10-21 MED ORDER — CEFTRIAXONE SODIUM 1 G IJ SOLR: 1.0000 g | Freq: Once | INTRAMUSCULAR | Status: DC

## 2013-10-21 NOTE — ED Notes (Signed)
Patient transported to X-ray 

## 2013-10-21 NOTE — ED Provider Notes (Signed)
CSN: [id number]     Arrival date & time 10/21/13  1256 History   First MD Initiated Contact with Patient 10/21/13 1308     Chief Complaint  Patient presents with  . Abdominal Pain    nausea, vomiting   (Consider location/radiation/quality/duration/timing/severity/associated sxs/prior Treatment) HPI  Emergency department chief complaint cough.  Patient states that yesterday while at work she had an episode of postprandial epigastric abdominal pain followed by nausea and vomiting.  Patient was sent home.  She had one episode of loose stool.  She then developed myalgias,  cough, fever, chills.  Patient used her daughter's cough medicine and was able to sleep through the night.  This morning she work awoke with worsening symptoms, cough, and fever at home of 101.  Patient came to the emergency department for evaluation.  She has not had a flu shot this year.  She has no history of pneumonia or asthma.  He should able to tolerate by mouth fluids at home, no further nausea, vomiting, diarrhea or abdominal pain. Denies DOE, SOB, chest tightness or pressure, radiation to left arm, jaw or back, or diaphoresis. Denies dysuria, flank pain, suprapubic pain, frequency, urgency, or hematuria. Denies headaches, light headedness, weakness, visual disturbances. Denies abdominal pain, nausea, vomiting, diarrhea or constipation.   History reviewed. No pertinent past medical history. History reviewed. No pertinent past surgical history. History reviewed. No pertinent family history. History  Substance Use Topics  . Smoking status: Never Smoker   . Smokeless tobacco: Never Used  . Alcohol Use: Yes     Comment: special occasions   OB History   Grav Para Term Preterm Abortions TAB SAB Ect Mult Living                 Review of Systems Ten systems reviewed and are negative for acute change, except as noted in the HPI.   Allergies  Review of patient's allergies indicates no known allergies.  Home  Medications   Current Outpatient Rx  Name  Route  Sig  Dispense  Refill  . acetaminophen (TYLENOL) 160 MG/5ML liquid   Oral   Take 1,920 mg by mouth every 4 (four) hours as needed for fever.          BP 122/79  Pulse 109  Temp(Src) 102.5 F (39.2 C) (Oral)  Resp 22  SpO2 99%  LMP 10/09/2013 Physical Exam Appears moderately ill but not toxic; temperature as noted in vitals. Ears normal. Eyes:glassy appearance, no discharge  Heart: RRR, NO M/G/R Throat and pharynx normal.   Neck supple. No adenopathyhy in the neck.  Sinuses non tender.  The chest is clear. Tight bronchitic cough. Abdomen is soft and nontender.  ED Course  Procedures (including critical care time) Labs Review Labs Reviewed  CBC WITH DIFFERENTIAL - Abnormal; Notable for the following:    Hemoglobin 11.5 (*)    HCT 34.0 (*)    Neutrophils Relative % 84 (*)    Lymphocytes Relative 7 (*)    Lymphs Abs 0.4 (*)    All other components within normal limits  COMPREHENSIVE METABOLIC PANEL - Abnormal; Notable for the following:    Albumin 3.3 (*)    All other components within normal limits  URINALYSIS, ROUTINE W REFLEX MICROSCOPIC - Abnormal; Notable for the following:    APPearance CLOUDY (*)    Ketones, ur 15 (*)    Protein, ur 30 (*)    Nitrite POSITIVE (*)    Leukocytes, UA SMALL (*)  All other components within normal limits  URINE MICROSCOPIC-ADD ON - Abnormal; Notable for the following:    Squamous Epithelial / LPF FEW (*)    Bacteria, UA MANY (*)    All other components within normal limits  URINE CULTURE  LIPASE, BLOOD  POCT PREGNANCY, URINE   Imaging Review No results found.  EKG Interpretation   None       MDM   1. UTI (lower urinary tract infection)   2. Influenza-like illness    1:43 PM BP 122/79  Pulse 109  Temp(Src) 102.5 F (39.2 C) (Oral)  Resp 22  SpO2 99%  LMP 10/09/2013 Patient with ILI, Febrile, tachycardia, labs, xray pending. IV fluids and tylenol.  3:26  PM Filed Vitals:   10/21/13 1515 10/21/13 1548 10/21/13 1600 10/21/13 1612  BP: 101/55 111/71 119/68   Pulse: 110 107 103   Temp:  102 F (38.9 C)  99.5 F (37.5 C)  TempSrc:  Oral  Oral  Resp:  16    Weight:      SpO2: 100% 98% 98%     Patient VSS, NoCVA tenderness. I feel sxs are liklely ILI with uti versus pyelo. No leukocytosis. Patient fever resolved. Patient given rocephin here. Will d/c with keflex. Patient declines tamiflu. Hemodynamically stable. Encouraged fluids, tylenol, advil, otc cough medicines.    Arthor Captain, PA-C 10/21/13 1821

## 2013-10-21 NOTE — ED Notes (Signed)
Patient said she went to work yesterday and was nauseated and vomiting.  She was sent home and continues to be sick.  She presents today with a high fever.  She took her temperature at home and it was 101.  The nausea and vomiting stopped however, she has had a productive cough.

## 2013-10-24 LAB — URINE CULTURE: Colony Count: >=100000

## 2013-10-25 ENCOUNTER — Telehealth (HOSPITAL_COMMUNITY): Payer: Self-pay | Admitting: Emergency Medicine

## 2013-10-25 NOTE — ED Notes (Signed)
Post ED Visit - Positive Culture Follow-up  Culture report reviewed by antimicrobial stewardship pharmacist: []  Wes Dulaney, Pharm.D., BCPS [x]  Celedonio Miyamoto, Pharm.D., BCPS []  Georgina Pillion, [address].D., BCPS []  [place], [address].D., BCPS, AAHIVP []  Estella Husk, Pharm.D., BCPS, AAHIVP  Positive urine culture Treated with Keflex, organism sensitive to the same and no further patient follow-up is required at this time.  Marcelle Overlie, Jenel Lucks 10/25/2013, 10:17 AM

## 2013-11-04 NOTE — ED Provider Notes (Signed)
Medical screening examination/treatment/procedure(s) were performed by non-physician practitioner and as supervising physician I was immediately available for consultation/collaboration.  EKG Interpretation   None        Edilia Ghuman, MD 11/04/13 1815 

## 2014-07-26 ENCOUNTER — Other Ambulatory Visit: Payer: Self-pay | Admitting: Obstetrics and Gynecology

## 2014-09-01 ENCOUNTER — Other Ambulatory Visit: Payer: Self-pay | Admitting: Obstetrics and Gynecology

## 2014-09-02 ENCOUNTER — Inpatient Hospital Stay (HOSPITAL_COMMUNITY): Admit: 2014-09-02 | Payer: Medicaid Other | Source: Ambulatory Visit | Admitting: Obstetrics and Gynecology

## 2014-09-26 ENCOUNTER — Other Ambulatory Visit: Payer: Self-pay | Admitting: Obstetrics and Gynecology

## 2014-09-26 MED ORDER — PENICILLIN G BENZATHINE 2400000 UNIT/4ML IM SUSP: 4.0000 mL | INTRAMUSCULAR | Status: DC
